# Patient Record
Sex: Female | Born: 1945 | Race: Black or African American | Hispanic: No | Marital: Single | Smoking: Former smoker
Health system: Southern US, Community
[De-identification: ages and names within clinical notes are randomized; demographics above are authoritative.]

## PROBLEM LIST (undated history)

## (undated) DIAGNOSIS — Z9221 Personal history of antineoplastic chemotherapy: Secondary | ICD-10-CM

## (undated) DIAGNOSIS — K219 Gastro-esophageal reflux disease without esophagitis: Secondary | ICD-10-CM

## (undated) DIAGNOSIS — E119 Type 2 diabetes mellitus without complications: Secondary | ICD-10-CM

## (undated) DIAGNOSIS — Z923 Personal history of irradiation: Secondary | ICD-10-CM

## (undated) DIAGNOSIS — I10 Essential (primary) hypertension: Secondary | ICD-10-CM

## (undated) DIAGNOSIS — J90 Pleural effusion, not elsewhere classified: Secondary | ICD-10-CM

## (undated) DIAGNOSIS — C801 Malignant (primary) neoplasm, unspecified: Secondary | ICD-10-CM

## (undated) DIAGNOSIS — M1990 Unspecified osteoarthritis, unspecified site: Secondary | ICD-10-CM

## (undated) DIAGNOSIS — N390 Urinary tract infection, site not specified: Secondary | ICD-10-CM

## (undated) HISTORY — PX: BREAST BIOPSY: SHX20

---

## 1995-02-24 HISTORY — PX: BREAST SURGERY: SHX581

## 2005-06-25 ENCOUNTER — Ambulatory Visit: Payer: Self-pay | Admitting: Internal Medicine

## 2007-07-07 ENCOUNTER — Ambulatory Visit: Payer: Self-pay | Admitting: Family Medicine

## 2009-04-17 ENCOUNTER — Ambulatory Visit: Payer: Self-pay | Admitting: Family Medicine

## 2010-06-12 ENCOUNTER — Ambulatory Visit: Payer: Self-pay | Admitting: Family Medicine

## 2011-08-13 ENCOUNTER — Ambulatory Visit: Payer: Self-pay | Admitting: Family Medicine

## 2012-01-05 ENCOUNTER — Emergency Department: Payer: Self-pay | Admitting: Emergency Medicine

## 2012-01-05 LAB — COMPREHENSIVE METABOLIC PANEL
Albumin: 4.1 g/dL (ref 3.4–5.0)
Anion Gap: 8 (ref 7–16)
BUN: 14 mg/dL (ref 7–18)
Calcium, Total: 9.9 mg/dL (ref 8.5–10.1)
Osmolality: 278 (ref 275–301)
Potassium: 3.9 mmol/L (ref 3.5–5.1)
SGOT(AST): 30 U/L (ref 15–37)
SGPT (ALT): 41 U/L (ref 12–78)
Sodium: 139 mmol/L (ref 136–145)
Total Protein: 8.5 g/dL — ABNORMAL HIGH (ref 6.4–8.2)

## 2012-01-05 LAB — CBC
HCT: 38.1 % (ref 35.0–47.0)
MCHC: 33.4 g/dL (ref 32.0–36.0)
Platelet: 185 10*3/uL (ref 150–440)
RDW: 13.7 % (ref 11.5–14.5)
WBC: 5.7 10*3/uL (ref 3.6–11.0)

## 2012-01-05 LAB — TROPONIN I: Troponin-I: <0.02 ng/mL

## 2013-10-10 ENCOUNTER — Ambulatory Visit: Payer: Self-pay | Admitting: Family Medicine

## 2013-10-16 ENCOUNTER — Ambulatory Visit: Payer: Self-pay | Admitting: Family Medicine

## 2014-04-06 IMAGING — CR DG CHEST 1V PORT
1 series · 1 of 1 positions shown · non-contrast
Comparison: none

REASON FOR EXAM: arm pain
COMMENTS:

[ap]
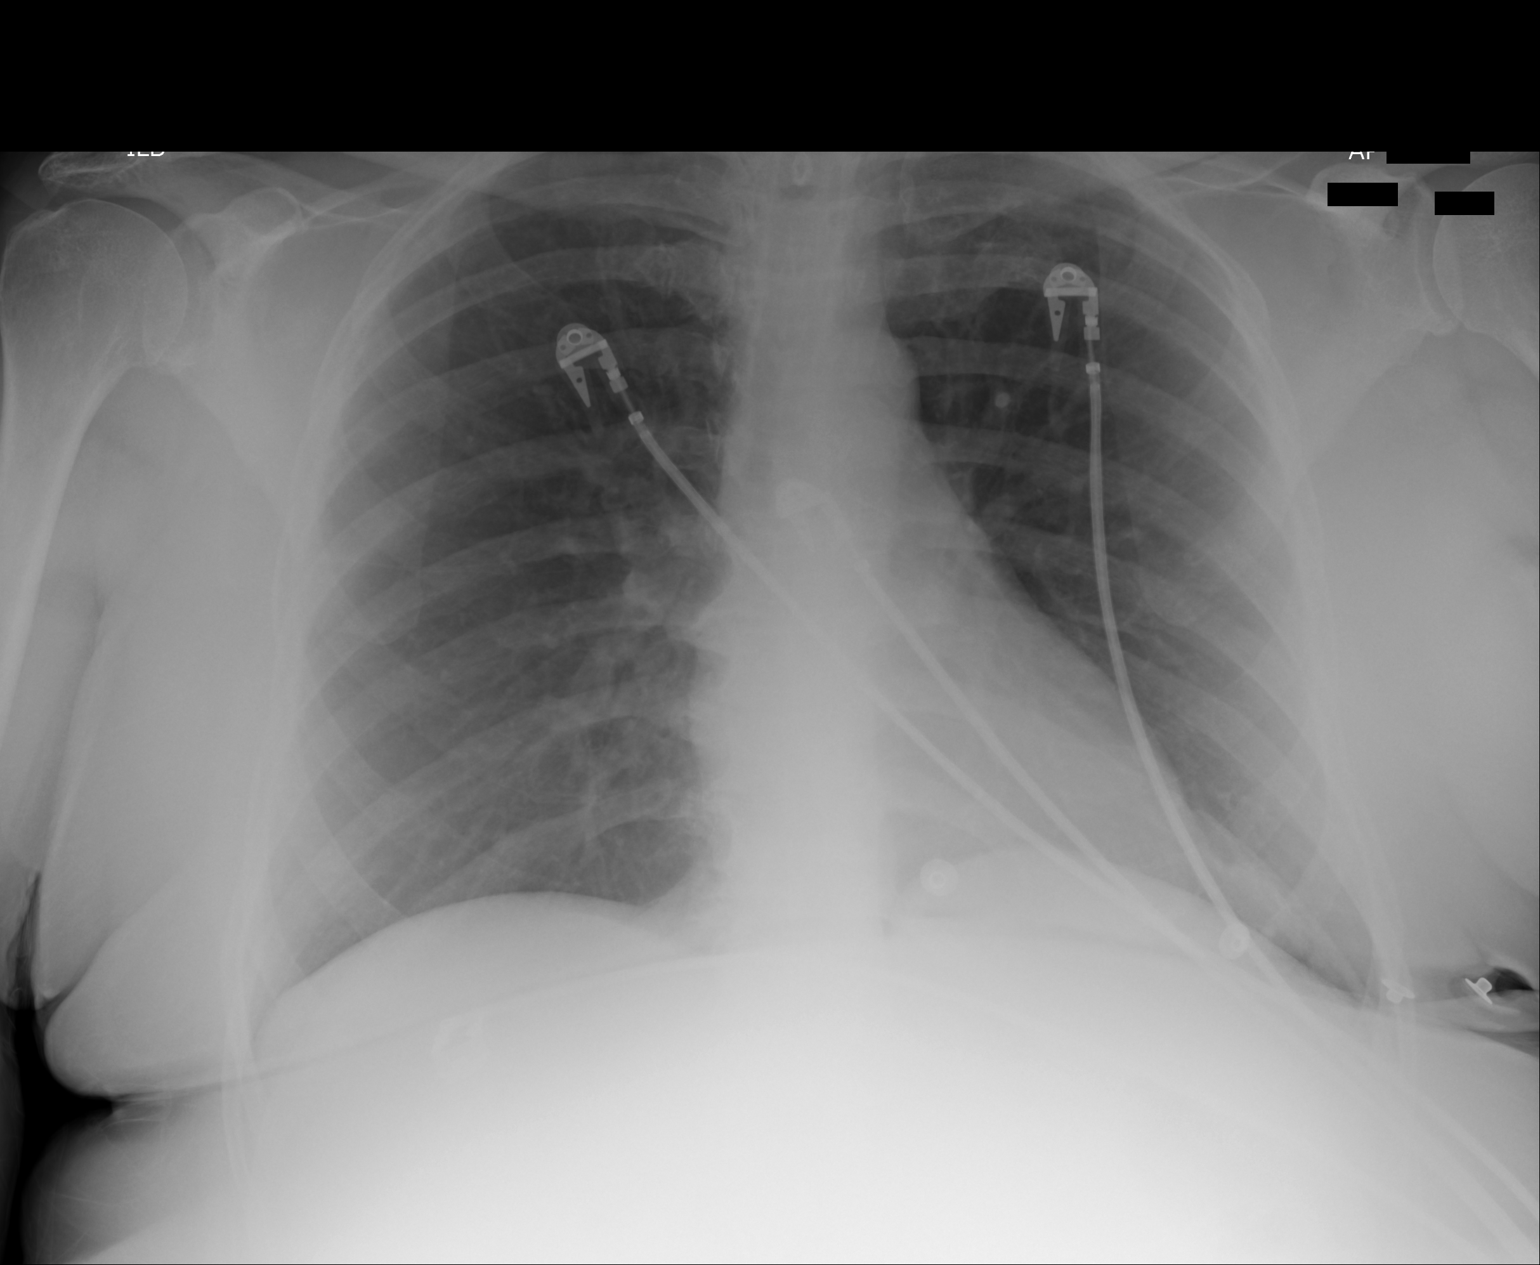

[1 of 1 positions shown; findings below may reference images not displayed]

PROCEDURE:     DXR - DXR PORTABLE CHEST SINGLE VIEW  - January 05, 2012  [DATE]

RESULT:     Cardiac monitoring electrodes are present. The lungs are clear.
The heart and pulmonary vessels are normal. The bony and mediastinal
structures are unremarkable. There is no effusion. There is no pneumothorax
or evidence of congestive failure.
IMPRESSION: No acute cardiopulmonary disease.

[REDACTED]

## 2015-07-09 ENCOUNTER — Encounter: Payer: Self-pay | Admitting: *Deleted

## 2015-07-12 NOTE — Discharge Instructions (Signed)

## 2015-07-15 ENCOUNTER — Ambulatory Visit
Admission: RE | Admit: 2015-07-15 | Discharge: 2015-07-15 | Disposition: A | Discharge status: Home / Self Care | Payer: BC Managed Care – PPO | Source: Ambulatory Visit | Attending: Ophthalmology | Admitting: Ophthalmology

## 2015-07-15 ENCOUNTER — Ambulatory Visit: Payer: BC Managed Care – PPO | Admitting: Anesthesiology

## 2015-07-15 ENCOUNTER — Encounter
Admission: RE | Disposition: A | Discharge status: Home / Self Care | Payer: Self-pay | Source: Ambulatory Visit | Attending: Ophthalmology

## 2015-07-15 DIAGNOSIS — Z87891 Personal history of nicotine dependence: Secondary | ICD-10-CM | POA: Diagnosis not present

## 2015-07-15 DIAGNOSIS — H2511 Age-related nuclear cataract, right eye: Secondary | ICD-10-CM | POA: Insufficient documentation

## 2015-07-15 DIAGNOSIS — M1990 Unspecified osteoarthritis, unspecified site: Secondary | ICD-10-CM | POA: Insufficient documentation

## 2015-07-15 DIAGNOSIS — Z85028 Personal history of other malignant neoplasm of stomach: Secondary | ICD-10-CM | POA: Diagnosis not present

## 2015-07-15 DIAGNOSIS — I10 Essential (primary) hypertension: Secondary | ICD-10-CM | POA: Insufficient documentation

## 2015-07-15 DIAGNOSIS — R002 Palpitations: Secondary | ICD-10-CM | POA: Diagnosis not present

## 2015-07-15 DIAGNOSIS — Z885 Allergy status to narcotic agent status: Secondary | ICD-10-CM | POA: Diagnosis not present

## 2015-07-15 DIAGNOSIS — E7800 Pure hypercholesterolemia, unspecified: Secondary | ICD-10-CM | POA: Diagnosis not present

## 2015-07-15 DIAGNOSIS — E119 Type 2 diabetes mellitus without complications: Secondary | ICD-10-CM | POA: Insufficient documentation

## 2015-07-15 DIAGNOSIS — Z8501 Personal history of malignant neoplasm of esophagus: Secondary | ICD-10-CM | POA: Insufficient documentation

## 2015-07-15 DIAGNOSIS — Z882 Allergy status to sulfonamides status: Secondary | ICD-10-CM | POA: Diagnosis not present

## 2015-07-15 HISTORY — DX: Type 2 diabetes mellitus without complications: E11.9

## 2015-07-15 HISTORY — DX: Unspecified osteoarthritis, unspecified site: M19.90

## 2015-07-15 HISTORY — PX: CATARACT EXTRACTION W/PHACO: SHX586

## 2015-07-15 HISTORY — DX: Gastro-esophageal reflux disease without esophagitis: K21.9

## 2015-07-15 HISTORY — DX: Essential (primary) hypertension: I10

## 2015-07-15 HISTORY — DX: Malignant (primary) neoplasm, unspecified: C80.1

## 2015-07-15 SURGERY — PHACOEMULSIFICATION, CATARACT, WITH IOL INSERTION
Anesthesia: Monitor Anesthesia Care | Laterality: Right | Wound class: Clean

## 2015-07-15 MED ORDER — ONDANSETRON HCL 4 MG/2ML IJ SOLN: 4.0000 mg | Freq: Once | INTRAMUSCULAR | Status: DC | PRN

## 2015-07-15 MED ORDER — BRIMONIDINE TARTRATE 0.2 % OP SOLN
OPHTHALMIC | Status: DC | PRN
  Administered 2015-07-15: 1 [drp] via OPHTHALMIC

## 2015-07-15 MED ORDER — POVIDONE-IODINE 5 % OP SOLN
1.0000 "application " | OPHTHALMIC | Status: DC | PRN
  Administered 2015-07-15: 1 via OPHTHALMIC

## 2015-07-15 MED ORDER — ARMC OPHTHALMIC DILATING GEL
1.0000 "application " | OPHTHALMIC | Status: DC | PRN
  Administered 2015-07-15 (×2): 1 via OPHTHALMIC

## 2015-07-15 MED ORDER — NA HYALUR & NA CHOND-NA HYALUR 0.4-0.35 ML IO KIT
PACK | INTRAOCULAR | Status: DC | PRN
  Administered 2015-07-15: 1 mL via INTRAOCULAR

## 2015-07-15 MED ORDER — CEFUROXIME OPHTHALMIC INJECTION 1 MG/0.1 ML
INJECTION | OPHTHALMIC | Status: DC | PRN
  Administered 2015-07-15: 0.1 mL via OPHTHALMIC

## 2015-07-15 MED ORDER — ACETAMINOPHEN 160 MG/5ML PO SOLN: 325.0000 mg | ORAL | Status: DC | PRN

## 2015-07-15 MED ORDER — ACETAMINOPHEN 325 MG PO TABS
325.0000 mg | ORAL_TABLET | ORAL | Status: DC | PRN
  Administered 2015-07-15: 650 mg via ORAL

## 2015-07-15 MED ORDER — TETRACAINE HCL 0.5 % OP SOLN
1.0000 [drp] | OPHTHALMIC | Status: DC | PRN
  Administered 2015-07-15: 1 [drp] via OPHTHALMIC

## 2015-07-15 MED ORDER — LIDOCAINE HCL (PF) 4 % IJ SOLN
INTRAOCULAR | Status: DC | PRN
  Administered 2015-07-15: 1 mL via OPHTHALMIC

## 2015-07-15 MED ORDER — TIMOLOL MALEATE 0.5 % OP SOLN
OPHTHALMIC | Status: DC | PRN
  Administered 2015-07-15: 1 [drp] via OPHTHALMIC

## 2015-07-15 MED ORDER — MIDAZOLAM HCL 2 MG/2ML IJ SOLN
INTRAMUSCULAR | Status: DC | PRN
  Administered 2015-07-15: 2 mg via INTRAVENOUS

## 2015-07-15 MED ORDER — FENTANYL CITRATE (PF) 100 MCG/2ML IJ SOLN
INTRAMUSCULAR | Status: DC | PRN
  Administered 2015-07-15: 50 ug via INTRAVENOUS

## 2015-07-15 MED ORDER — EPINEPHRINE HCL 1 MG/ML IJ SOLN
INTRAOCULAR | Status: DC | PRN
  Administered 2015-07-15: 90 mL via OPHTHALMIC

## 2015-07-15 SURGICAL SUPPLY — 28 items

## 2015-07-15 NOTE — Anesthesia Procedure Notes (Signed)
Procedure Name: MAC Performed by: Dakai Braithwaite Pre-anesthesia Checklist: Patient identified, Emergency Drugs available, Suction available, Timeout performed and Patient being monitored Patient Re-evaluated:Patient Re-evaluated prior to inductionOxygen Delivery Method: Nasal cannula Placement Confirmation: positive ETCO2     

## 2015-07-15 NOTE — Anesthesia Postprocedure Evaluation (Signed)
Anesthesia Post Note  Patient: Patricia Wolfe  Procedure(s) Performed: Procedure(s) (LRB): CATARACT EXTRACTION PHACO AND INTRAOCULAR LENS PLACEMENT (IOC) right eye (Right)  Patient location during evaluation: PACU Anesthesia Type: MAC Level of consciousness: awake and alert Pain management: pain level controlled Vital Signs Assessment: post-procedure vital signs reviewed and stable Respiratory status: spontaneous breathing, nonlabored ventilation, respiratory function stable and patient connected to nasal cannula oxygen Cardiovascular status: stable and blood pressure returned to baseline Anesthetic complications: no    Amaryllis Dyke

## 2015-07-15 NOTE — H&P (Signed)
H+P reviewed and is up to date, please see paper chart.  

## 2015-07-15 NOTE — Transfer of Care (Signed)
Immediate Anesthesia Transfer of Care Note  Patient: Patricia Wolfe  Procedure(s) Performed: Procedure(s) with comments: CATARACT EXTRACTION PHACO AND INTRAOCULAR LENS PLACEMENT (IOC) right eye (Right) - DIABETIC DIET CONTROL   Patient Location: PACU  Anesthesia Type: MAC  Level of Consciousness: awake, alert  and patient cooperative  Airway and Oxygen Therapy: Patient Spontanous Breathing and Patient connected to supplemental oxygen  Post-op Assessment: Post-op Vital signs reviewed, Patient's Cardiovascular Status Stable, Respiratory Function Stable, Patent Airway and No signs of Nausea or vomiting  Post-op Vital Signs: Reviewed and stable  Complications: No apparent anesthesia complications

## 2015-07-15 NOTE — Anesthesia Preprocedure Evaluation (Addendum)
Anesthesia Evaluation  Patient identified by MRN, date of birth, ID band Patient awake    Airway Mallampati: II  TM Distance: >3 FB Neck ROM: full    Dental   Pulmonary former smoker,    breath sounds clear to auscultation       Cardiovascular hypertension, Normal cardiovascular exam     Neuro/Psych    GI/Hepatic GERD  ,Esophageal and stomach CA    Endo/Other  diabetes  Renal/GU      Musculoskeletal   Abdominal   Peds  Hematology   Anesthesia Other Findings   Reproductive/Obstetrics                            Anesthesia Physical Anesthesia Plan  ASA: III  Anesthesia Plan: MAC   Post-op Pain Management:    Induction:   Airway Management Planned:   Additional Equipment:   Intra-op Plan:   Post-operative Plan:   Informed Consent: I have reviewed the patients History and Physical, chart, labs and discussed the procedure including the risks, benefits and alternatives for the proposed anesthesia with the patient or authorized representative who has indicated his/her understanding and acceptance.     Plan Discussed with: CRNA  Anesthesia Plan Comments:        Anesthesia Quick Evaluation

## 2015-07-15 NOTE — Op Note (Signed)
Date of Surgery: 07/15/2015  PREOPERATIVE DIAGNOSES: Visually significant nuclear sclerotic cataract, right eye.  POSTOPERATIVE DIAGNOSES: Same  PROCEDURES PERFORMED: Cataract extraction with intraocular lens implant, right eye.  SURGEON: Almon Hercules, M.D.  ANESTHESIA: MAC and topical  IMPLANTS: AcrySof IQ [id number] +20.5 via [id number] injector  Implant Name Type Inv. Item Serial No. Manufacturer Lot No. LRB No. Used  LENS IOL ACRYSOF IQ 20.5 - [id number] Intraocular Lens LENS IOL ACRYSOF IQ 20.5 [id number] [employer]   Right 1     COMPLICATIONS: None.  DESCRIPTION OF PROCEDURE: Therapeutic options were discussed with the patient preoperatively, including a discussion of risks and benefits of surgery. Informed consent was obtained. An IOL-Master and immersion biometry were used to take the lens measurements, and a dilated fundus exam was performed within 6 months of the surgical date.  The patient was premedicated and brought to the operating room and placed on the operating table in the supine position. After adequate anesthesia, the patient was prepped and draped in the usual sterile ophthalmic fashion. A wire lid speculum was inserted and the microscope was positioned. A Superblade was used to create a paracentesis site at the limbus and a small amount of dilute preservative free lidocaine was instilled into the anterior chamber, followed by dispersive viscoelastic. A clear corneal incision was created temporally using a 2.4 mm keratome blade. Capsulorrhexis was then performed. In situ phacoemulsification was performed.  Cortical material was removed with the irrigation-aspiration unit. Dispersive viscoelastic was instilled to open the capsular bag. A posterior chamber intraocular lens with the specifications above was inserted and positioned. Irrigation-aspiration was used to remove all viscoelastic. Cefuroxime 1cc was instilled into the anterior chamber, and the corneal incision was checked  and found to be water tight. The eyelid speculum was removed.  The operative eye was covered with protective goggles after instilling 1 drop of timolol and brimonidine. The patient tolerated the procedure well. There were no complications.

## 2015-07-16 ENCOUNTER — Encounter: Payer: Self-pay | Admitting: Ophthalmology

## 2015-08-19 ENCOUNTER — Other Ambulatory Visit: Payer: Self-pay | Admitting: Family Medicine

## 2015-08-19 DIAGNOSIS — Z1231 Encounter for screening mammogram for malignant neoplasm of breast: Secondary | ICD-10-CM

## 2015-09-03 DIAGNOSIS — J90 Pleural effusion, not elsewhere classified: Secondary | ICD-10-CM

## 2015-09-03 HISTORY — DX: Pleural effusion, not elsewhere classified: J90

## 2015-09-04 ENCOUNTER — Ambulatory Visit: Admission: RE | Admit: 2015-09-04 | Payer: BC Managed Care – PPO | Source: Ambulatory Visit

## 2015-10-03 ENCOUNTER — Encounter: Payer: Self-pay | Admitting: *Deleted

## 2015-10-03 DIAGNOSIS — N390 Urinary tract infection, site not specified: Secondary | ICD-10-CM

## 2015-10-03 HISTORY — DX: Urinary tract infection, site not specified: N39.0

## 2015-10-07 ENCOUNTER — Ambulatory Visit
Admission: RE | Admit: 2015-10-07 | Discharge: 2015-10-07 | Disposition: A | Discharge status: Home / Self Care | Payer: BC Managed Care – PPO | Source: Ambulatory Visit | Attending: Ophthalmology | Admitting: Ophthalmology

## 2015-10-07 ENCOUNTER — Ambulatory Visit: Payer: BC Managed Care – PPO | Admitting: Anesthesiology

## 2015-10-07 ENCOUNTER — Encounter
Admission: RE | Disposition: A | Discharge status: Home / Self Care | Payer: Self-pay | Source: Ambulatory Visit | Attending: Ophthalmology

## 2015-10-07 DIAGNOSIS — Z79891 Long term (current) use of opiate analgesic: Secondary | ICD-10-CM | POA: Diagnosis not present

## 2015-10-07 DIAGNOSIS — Z885 Allergy status to narcotic agent status: Secondary | ICD-10-CM | POA: Diagnosis not present

## 2015-10-07 DIAGNOSIS — H2512 Age-related nuclear cataract, left eye: Secondary | ICD-10-CM | POA: Insufficient documentation

## 2015-10-07 DIAGNOSIS — Z8501 Personal history of malignant neoplasm of esophagus: Secondary | ICD-10-CM | POA: Insufficient documentation

## 2015-10-07 DIAGNOSIS — Z87891 Personal history of nicotine dependence: Secondary | ICD-10-CM | POA: Insufficient documentation

## 2015-10-07 DIAGNOSIS — Z923 Personal history of irradiation: Secondary | ICD-10-CM | POA: Diagnosis not present

## 2015-10-07 DIAGNOSIS — I10 Essential (primary) hypertension: Secondary | ICD-10-CM | POA: Diagnosis not present

## 2015-10-07 DIAGNOSIS — Z9889 Other specified postprocedural states: Secondary | ICD-10-CM | POA: Diagnosis not present

## 2015-10-07 DIAGNOSIS — Z85028 Personal history of other malignant neoplasm of stomach: Secondary | ICD-10-CM | POA: Diagnosis not present

## 2015-10-07 DIAGNOSIS — Z791 Long term (current) use of non-steroidal anti-inflammatories (NSAID): Secondary | ICD-10-CM | POA: Insufficient documentation

## 2015-10-07 DIAGNOSIS — Z9109 Other allergy status, other than to drugs and biological substances: Secondary | ICD-10-CM | POA: Diagnosis not present

## 2015-10-07 DIAGNOSIS — E119 Type 2 diabetes mellitus without complications: Secondary | ICD-10-CM | POA: Diagnosis not present

## 2015-10-07 DIAGNOSIS — Z9221 Personal history of antineoplastic chemotherapy: Secondary | ICD-10-CM | POA: Insufficient documentation

## 2015-10-07 DIAGNOSIS — Z79899 Other long term (current) drug therapy: Secondary | ICD-10-CM | POA: Diagnosis not present

## 2015-10-07 DIAGNOSIS — M1990 Unspecified osteoarthritis, unspecified site: Secondary | ICD-10-CM | POA: Diagnosis not present

## 2015-10-07 DIAGNOSIS — E7800 Pure hypercholesterolemia, unspecified: Secondary | ICD-10-CM | POA: Diagnosis not present

## 2015-10-07 DIAGNOSIS — Z882 Allergy status to sulfonamides status: Secondary | ICD-10-CM | POA: Insufficient documentation

## 2015-10-07 HISTORY — DX: Pleural effusion, not elsewhere classified: J90

## 2015-10-07 HISTORY — DX: Urinary tract infection, site not specified: N39.0

## 2015-10-07 HISTORY — PX: CATARACT EXTRACTION W/PHACO: SHX586

## 2015-10-07 SURGERY — PHACOEMULSIFICATION, CATARACT, WITH IOL INSERTION
Anesthesia: Monitor Anesthesia Care | Laterality: Left | Wound class: Clean

## 2015-10-07 MED ORDER — BRIMONIDINE TARTRATE 0.2 % OP SOLN
OPHTHALMIC | Status: DC | PRN
  Administered 2015-10-07: 1 [drp] via OPHTHALMIC

## 2015-10-07 MED ORDER — MIDAZOLAM HCL 2 MG/2ML IJ SOLN
INTRAMUSCULAR | Status: DC | PRN
  Administered 2015-10-07: 1 mg via INTRAVENOUS

## 2015-10-07 MED ORDER — TETRACAINE HCL 0.5 % OP SOLN
1.0000 [drp] | OPHTHALMIC | Status: DC | PRN
  Administered 2015-10-07: 1 [drp] via OPHTHALMIC

## 2015-10-07 MED ORDER — LACTATED RINGERS IV SOLN: INTRAVENOUS | Status: DC

## 2015-10-07 MED ORDER — POVIDONE-IODINE 5 % OP SOLN
1.0000 "application " | OPHTHALMIC | Status: DC | PRN
  Administered 2015-10-07: 1 via OPHTHALMIC

## 2015-10-07 MED ORDER — CEFUROXIME OPHTHALMIC INJECTION 1 MG/0.1 ML
INJECTION | OPHTHALMIC | Status: DC | PRN
  Administered 2015-10-07: 0.1 mL via INTRACAMERAL

## 2015-10-07 MED ORDER — NA HYALUR & NA CHOND-NA HYALUR 0.4-0.35 ML IO KIT
PACK | INTRAOCULAR | Status: DC | PRN
  Administered 2015-10-07: 1 mL via INTRAOCULAR

## 2015-10-07 MED ORDER — TIMOLOL MALEATE 0.5 % OP SOLN
OPHTHALMIC | Status: DC | PRN
  Administered 2015-10-07: 1 [drp] via OPHTHALMIC

## 2015-10-07 MED ORDER — FENTANYL CITRATE (PF) 100 MCG/2ML IJ SOLN
INTRAMUSCULAR | Status: DC | PRN
  Administered 2015-10-07: 50 ug via INTRAVENOUS

## 2015-10-07 MED ORDER — BSS IO SOLN
INTRAOCULAR | Status: DC | PRN
  Administered 2015-10-07: 75 mL via OPHTHALMIC

## 2015-10-07 MED ORDER — ARMC OPHTHALMIC DILATING GEL
1.0000 "application " | OPHTHALMIC | Status: DC | PRN
  Administered 2015-10-07 (×2): 1 via OPHTHALMIC

## 2015-10-07 MED ORDER — LIDOCAINE HCL (PF) 4 % IJ SOLN
INTRAMUSCULAR | Status: DC | PRN
  Administered 2015-10-07: 1 mL via OPHTHALMIC

## 2015-10-07 SURGICAL SUPPLY — 28 items

## 2015-10-07 NOTE — Anesthesia Procedure Notes (Signed)
Procedure Name: MAC Date/Time: 10/07/2015 7:51 AM Performed by: Janna Arch Pre-anesthesia Checklist: Patient identified, Emergency Drugs available, Suction available, Patient being monitored and Timeout performed Patient Re-evaluated:Patient Re-evaluated prior to inductionOxygen Delivery Method: Nasal cannula

## 2015-10-07 NOTE — Discharge Instructions (Signed)

## 2015-10-07 NOTE — Anesthesia Preprocedure Evaluation (Signed)
Anesthesia Evaluation  Patient identified by MRN, date of birth, ID band Patient awake    Reviewed: Allergy & Precautions, H&P , NPO status , Patient's Chart, lab work & pertinent test results  Airway Mallampati: III  TM Distance: >3 FB Neck ROM: full    Dental no notable dental hx.    Pulmonary former smoker,    Pulmonary exam normal        Cardiovascular hypertension, Normal cardiovascular exam     Neuro/Psych    GI/Hepatic   Endo/Other  diabetes  Renal/GU      Musculoskeletal   Abdominal   Peds  Hematology   Anesthesia Other Findings   Reproductive/Obstetrics                             Anesthesia Physical Anesthesia Plan  ASA: II  Anesthesia Plan: MAC   Post-op Pain Management:    Induction:   Airway Management Planned:   Additional Equipment:   Intra-op Plan:   Post-operative Plan:   Informed Consent: I have reviewed the patients History and Physical, chart, labs and discussed the procedure including the risks, benefits and alternatives for the proposed anesthesia with the patient or authorized representative who has indicated his/her understanding and acceptance.     Plan Discussed with:   Anesthesia Plan Comments:         Anesthesia Quick Evaluation

## 2015-10-07 NOTE — Anesthesia Postprocedure Evaluation (Signed)
Anesthesia Post Note  Patient: Patricia Wolfe  Procedure(s) Performed: Procedure(s) (LRB): CATARACT EXTRACTION PHACO AND INTRAOCULAR LENS PLACEMENT (IOC) (Left)  Patient location during evaluation: PACU Anesthesia Type: MAC Level of consciousness: awake and alert and oriented Pain management: satisfactory to patient Vital Signs Assessment: post-procedure vital signs reviewed and stable Respiratory status: spontaneous breathing, nonlabored ventilation and respiratory function stable Cardiovascular status: blood pressure returned to baseline and stable Postop Assessment: Adequate PO intake and No signs of nausea or vomiting Anesthetic complications: no    Raliegh Ip

## 2015-10-07 NOTE — H&P (Signed)
H+P reviewed and is up to date, please see paper chart.  

## 2015-10-07 NOTE — Transfer of Care (Signed)
Immediate Anesthesia Transfer of Care Note  Patient: Patricia Wolfe  Procedure(s) Performed: Procedure(s) with comments: CATARACT EXTRACTION PHACO AND INTRAOCULAR LENS PLACEMENT (IOC) (Left) - VISION BLUE DIET CONTROL DIABETIC LEFT  Patient Location: PACU  Anesthesia Type: MAC  Level of Consciousness: awake, alert  and patient cooperative  Airway and Oxygen Therapy: Patient Spontanous Breathing and Patient connected to supplemental oxygen  Post-op Assessment: Post-op Vital signs reviewed, Patient's Cardiovascular Status Stable, Respiratory Function Stable, Patent Airway and No signs of Nausea or vomiting  Post-op Vital Signs: Reviewed and stable  Complications: No apparent anesthesia complications

## 2015-10-07 NOTE — Op Note (Signed)
Date of Surgery: 10/07/2015  PREOPERATIVE DIAGNOSES: Visually significant cortical cataract, left eye.  POSTOPERATIVE DIAGNOSES: Same  PROCEDURES PERFORMED: Cataract extraction with intraocular lens implant, left eye.  SURGEON: Almon Hercules, M.D.  ANESTHESIA: MAC and topical  IMPLANTS: [id number] +20.5 D  Implant Name Type Inv. Item Serial No. Manufacturer Lot No. LRB No. Used  LENS IOL ACRYSOF IQ 20.5 - [id number] Intraocular Lens LENS IOL ACRYSOF IQ 20.5 [id number] [employer]   Left 1    COMPLICATIONS: None.  DESCRIPTION OF PROCEDURE: Therapeutic options were discussed with the patient preoperatively, including a discussion of risks and benefits of surgery. Informed consent was obtained. An IOL-Master and immersion biometry were used to take the lens measurements, and a dilated fundus exam was performed within 6 months of the surgical date.  The patient was premedicated and brought to the operating room and placed on the operating table in the supine position. After adequate anesthesia, the patient was prepped and draped in the usual sterile ophthalmic fashion. A wire lid speculum was inserted and the microscope was positioned. A Superblade was used to create a paracentesis site at the limbus and a small amount of dilute preservative free lidocaine was instilled into the anterior chamber, followed by dispersive viscoelastic. A clear corneal incision was created temporally using a 2.4 mm keratome blade. Capsulorrhexis was then performed. In situ phacoemulsification was performed.  Cortical material was removed with the irrigation-aspiration unit. Dispersive viscoelastic was instilled to open the capsular bag. A posterior chamber intraocular lens with the specifications above was inserted and positioned. Irrigation-aspiration was used to remove all viscoelastic. Cefuroxime 1cc was instilled into the anterior chamber, and the corneal incision was checked and found to be water tight. The eyelid  speculum was removed.  The operative eye was covered with protective goggles after instilling 1 drop of timolol and brimonidine. The patient tolerated the procedure well. There were no complications.

## 2015-10-08 ENCOUNTER — Encounter: Payer: Self-pay | Admitting: Ophthalmology

## 2016-01-07 ENCOUNTER — Ambulatory Visit
Admission: RE | Admit: 2016-01-07 | Discharge: 2016-01-07 | Disposition: A | Discharge status: Home / Self Care | Payer: Medicare PPO | Source: Ambulatory Visit | Attending: Family Medicine | Admitting: Family Medicine

## 2016-01-07 ENCOUNTER — Other Ambulatory Visit: Payer: Self-pay | Admitting: Family Medicine

## 2016-01-07 DIAGNOSIS — Z1231 Encounter for screening mammogram for malignant neoplasm of breast: Secondary | ICD-10-CM

## 2016-01-07 HISTORY — DX: Personal history of irradiation: Z92.3

## 2016-01-07 HISTORY — DX: Personal history of antineoplastic chemotherapy: Z92.21

## 2016-03-26 DEATH — deceased
